# Patient Record
Sex: Male | Born: 1935 | Hispanic: Yes | Marital: Single
Health system: Southern US, Community
[De-identification: ages and names within clinical notes are randomized; demographics above are authoritative.]

---

## 2019-05-22 DIAGNOSIS — Y908 Blood alcohol level of 240 mg/100 ml or more: Secondary | ICD-10-CM | POA: Insufficient documentation

## 2019-05-22 DIAGNOSIS — F10929 Alcohol use, unspecified with intoxication, unspecified: Secondary | ICD-10-CM

## 2019-05-22 LAB — CBC
HCT: 49.5 % (ref 39.0–52.0)
Hemoglobin: 16.8 g/dL (ref 13.0–17.0)
MCH: 28.7 pg (ref 26.0–34.0)
MCHC: 33.9 g/dL (ref 30.0–36.0)
MCV: 84.5 fL (ref 80.0–100.0)
Platelets: 314 10*3/uL (ref 150–400)
RBC: 5.86 MIL/uL — ABNORMAL HIGH (ref 4.22–5.81)
RDW: 13.3 % (ref 11.5–15.5)
WBC: 10.7 10*3/uL — ABNORMAL HIGH (ref 4.0–10.5)
nRBC: 0 % (ref 0.0–0.2)

## 2019-05-22 LAB — ETHANOL: Alcohol, Ethyl (B): 437 mg/dL (ref <10)

## 2019-05-22 LAB — RAPID URINE DRUG SCREEN, HOSP PERFORMED
Amphetamines: NOT DETECTED
Barbiturates: NOT DETECTED
Benzodiazepines: NOT DETECTED
Cocaine: NOT DETECTED
Opiates: NOT DETECTED
Tetrahydrocannabinol: NOT DETECTED

## 2019-05-22 LAB — COMPREHENSIVE METABOLIC PANEL
ALT: 38 U/L (ref 0–44)
AST: 24 U/L (ref 15–41)
Albumin: 4.5 g/dL (ref 3.5–5.0)
Alkaline Phosphatase: 94 U/L (ref 38–126)
Anion gap: 10 (ref 5–15)
BUN: 5 mg/dL — ABNORMAL LOW (ref 8–23)
CO2: 23 mmol/L (ref 22–32)
Calcium: 7.8 mg/dL — ABNORMAL LOW (ref 8.9–10.3)
Chloride: 113 mmol/L — ABNORMAL HIGH (ref 98–111)
Creatinine, Ser: 0.64 mg/dL (ref 0.61–1.24)
GFR calc Af Amer: >60 mL/min (ref >60)
GFR calc non Af Amer: >60 mL/min (ref >60)
Glucose, Bld: 96 mg/dL (ref 70–99)
Potassium: 3.4 mmol/L — ABNORMAL LOW (ref 3.5–5.1)
Sodium: 146 mmol/L — ABNORMAL HIGH (ref 135–145)
Total Bilirubin: 0.9 mg/dL (ref 0.3–1.2)
Total Protein: 7.1 g/dL (ref 6.5–8.1)

## 2019-05-22 LAB — CBG MONITORING, ED: Glucose-Capillary: 97 mg/dL (ref 70–99)

## 2019-05-22 NOTE — ED Notes (Signed)
Date and time results received: 05/22/19 1946  Test: ETOH Critical Value:437  Name of Provider Notified: Dr. Ronnald Nian Orders Received? Or Actions Taken?:

## 2019-05-22 NOTE — ED Notes (Signed)
[place] [phone]) used to communicate with patient. Pt able to answer questions without difficulty. Will continue to monitor patient.

## 2019-05-22 NOTE — ED Triage Notes (Addendum)
Patient arrived by EMS from somewhere. Patient arrived w/ alcohol intoxication. Patient is responding to pain.   EMS stated that patients friends claimed that the patient had been drinking all day.   Patient is incontinent of bowel and bladder.   Patient was found collapsed at 1700.   We are unaware of patients name or health history.

## 2019-05-22 NOTE — ED Provider Notes (Signed)
[facility] DEPT Provider Note   CSN: [id number] Arrival date & time: 05/22/19  1803    History   Chief Complaint Chief Complaint  Patient presents with  . Alcohol Intoxication    HPI Jacob Brooks is a 85 y.o. male.     Level 5 caveat due to altered mental status.  EMS states that patient has been drinking alcohol all day.  Was found obtunded by his friends.  Unknown if there was any trauma.  Normal blood sugar.  They did not find any signs of trauma at the scene.  Patient will respond to pain and open his eyes spontaneously per EMS.  The history is provided by the EMS personnel.  Altered Mental Status Presenting symptoms: partial responsiveness   Severity:  Moderate Most recent episode:  Today Episode history:  Single Timing:  Constant Progression:  Unchanged Chronicity:  New Context: alcohol use     History reviewed. No pertinent past medical history.  There are no active problems to display for this patient.   History reviewed. No pertinent surgical history.      Home Medications    Prior to Admission medications   Not on File    Family History History reviewed. No pertinent family history.  Social History Social History   Tobacco Use  . Smoking status: Unknown If Ever Smoked  Substance Use Topics  . Alcohol use: Yes  . Drug use: Not on file     Allergies   Patient has no allergy information on record.   Review of Systems Review of Systems  Unable to perform ROS: Mental status change     Physical Exam Updated Vital Signs BP 127/89   Pulse 85   Temp 98.7 F (37.1 C) (Oral)   Resp 19   Wt 81.6 kg   SpO2 93%   Physical Exam Vitals signs and nursing note reviewed.  Constitutional:      General: He is not in acute distress.    Appearance: He is well-developed. He is not ill-appearing.  HENT:     Head: Normocephalic and atraumatic.  Eyes:     Extraocular Movements: Extraocular movements intact.    Conjunctiva/sclera: Conjunctivae normal.     Pupils: Pupils are equal, round, and reactive to light.  Neck:     Musculoskeletal: Normal range of motion and neck supple.  Cardiovascular:     Rate and Rhythm: Normal rate and regular rhythm.     Pulses: Normal pulses.     Heart sounds: Normal heart sounds. No murmur.  Pulmonary:     Effort: Pulmonary effort is normal. No respiratory distress.     Breath sounds: Normal breath sounds.  Abdominal:     General: There is no distension.     Palpations: Abdomen is soft.     Tenderness: There is no abdominal tenderness.  Skin:    General: Skin is warm and dry.  Neurological:     GCS: GCS eye subscore is 4. GCS verbal subscore is 1. GCS motor subscore is 5.      ED Treatments / Results  Labs (all labs ordered are listed, but only abnormal results are displayed) Labs Reviewed  ETHANOL - Abnormal; Notable for the following components:      Result Value   Alcohol, Ethyl (B) 437 (*)    All other components within normal limits  CBC - Abnormal; Notable for the following components:   WBC 10.7 (*)    RBC 5.86 (*)  All other components within normal limits  COMPREHENSIVE METABOLIC PANEL - Abnormal; Notable for the following components:   Sodium 146 (*)    Potassium 3.4 (*)    Chloride 113 (*)    BUN 5 (*)    Calcium 7.8 (*)    All other components within normal limits  RAPID URINE DRUG SCREEN, HOSP PERFORMED  CBG MONITORING, ED    EKG EKG Interpretation  Date/Time:  Thursday May 22 2019 18:15:27 EDT Ventricular Rate:  117 PR Interval:    QRS Duration: 105 QT Interval:  332 QTC Calculation: 464 R Axis:   78 Text Interpretation:  Sinus tachycardia Probable left atrial enlargement Borderline T abnormalities, inferior leads Confirmed by Virgina NorfolkAdam, Azaela Caracci [id number]) on 05/22/2019 7:12:12 PM   Radiology Ct Head Wo Contrast  Result Date: 05/22/2019 CLINICAL DATA:  Significant intoxication with altered level of consciousness. EXAM: CT  HEAD WITHOUT CONTRAST CT CERVICAL SPINE WITHOUT CONTRAST TECHNIQUE: Multidetector CT imaging of the head and cervical spine was performed following the standard protocol without intravenous contrast. Multiplanar CT image reconstructions of the cervical spine were also generated. COMPARISON:  None. FINDINGS: CT HEAD FINDINGS Brain: No evidence of acute infarction, hemorrhage, hydrocephalus, extra-axial collection or mass lesion/mass effect. Vascular: No hyperdense vessel or unexpected calcification. Skull: Normal. Negative for fracture or focal lesion. Sinuses/Orbits: No acute finding. Other: None. CT CERVICAL SPINE FINDINGS Alignment: Normal. Skull base and vertebrae: 7 cervical segments are well visualized. Vertebral body height is well maintained. No acute fracture or acute facet abnormality is noted. Soft tissues and spinal canal: The surrounding soft tissue structures are within normal limits. Upper chest: Visualized lung apices are within normal limits. Other: None IMPRESSION: CT of the head: No acute intracranial abnormality. CT of the cervical spine: No acute abnormality noted. Electronically Signed   By: Alcide CleverMark  Lukens M.D.   On: 05/22/2019 21:20   Ct Cervical Spine Wo Contrast  Result Date: 05/22/2019 CLINICAL DATA:  Significant intoxication with altered level of consciousness. EXAM: CT HEAD WITHOUT CONTRAST CT CERVICAL SPINE WITHOUT CONTRAST TECHNIQUE: Multidetector CT imaging of the head and cervical spine was performed following the standard protocol without intravenous contrast. Multiplanar CT image reconstructions of the cervical spine were also generated. COMPARISON:  None. FINDINGS: CT HEAD FINDINGS Brain: No evidence of acute infarction, hemorrhage, hydrocephalus, extra-axial collection or mass lesion/mass effect. Vascular: No hyperdense vessel or unexpected calcification. Skull: Normal. Negative for fracture or focal lesion. Sinuses/Orbits: No acute finding. Other: None. CT CERVICAL SPINE FINDINGS  Alignment: Normal. Skull base and vertebrae: 7 cervical segments are well visualized. Vertebral body height is well maintained. No acute fracture or acute facet abnormality is noted. Soft tissues and spinal canal: The surrounding soft tissue structures are within normal limits. Upper chest: Visualized lung apices are within normal limits. Other: None IMPRESSION: CT of the head: No acute intracranial abnormality. CT of the cervical spine: No acute abnormality noted. Electronically Signed   By: Alcide CleverMark  Lukens M.D.   On: 05/22/2019 21:20   Dg Chest Portable 1 View  Result Date: 05/22/2019 CLINICAL DATA:  Altered mental status, intoxicated EXAM: PORTABLE CHEST 1 VIEW COMPARISON:  None. FINDINGS: Hypoventilatory changes in the lungs. No consolidation, features of edema, pneumothorax, or effusion. Pulmonary vascularity is normally distributed. The cardiomediastinal contours are unremarkable. No acute osseous or soft tissue abnormality. IMPRESSION: Low volumes, otherwise no acute cardiopulmonary abnormality. Electronically Signed   By: Kreg ShropshirePrice  DeHay M.D.   On: 05/22/2019 19:33    Procedures Procedures (including critical care time)  Medications Ordered in ED Medications  sodium chloride 0.9 % bolus 1,000 mL (0 mLs Intravenous Stopped 05/22/19 1955)     Initial Impression / Assessment and Plan / ED Course  I have reviewed the triage vital signs and the nursing notes.  Pertinent labs & imaging results that were available during my care of the patient were reviewed by me and considered in my medical decision making (see chart for details).     Jacob Brooks is unknown male who presents to the ED with alcohol intoxication, change in mental status.  Brought in by police after was found obtunded.  Friends at the scene state he had been drinking all day.  Patient localizes the pain, opens eyes spontaneously but does not speak.  Pupils are 4 mm and reactive.  There is no signs of trauma on exam.  Will get lab work  including alcohol level, CT scan of his head.  Blood sugar is normal.  Alcohol level is in the 400s.  CT scan of head and neck are unremarkable.  Chest x-ray normal.  Lab work otherwise unremarkable.  Drug screen is negative.  Patient will metabolize ethanol and anticipate discharge to home.  Was signed out to oncoming ED provider with patient pending full metabolizing of alcohol.  Please see their note for further results, evaluation, disposition of the patient.  On reevaluation prior to discharge patient is more awake and improving.  He was able to tell me his name and his birthday but fell back asleep.  This chart was dictated using voice recognition software.  Despite best efforts to proofread,  errors can occur which can change the documentation meaning.    Final Clinical Impressions(s) / ED Diagnoses   Final diagnoses:  Alcoholic intoxication with complication [facility](HCC)    ED Discharge Orders    None       Virgina NorfolkCuratolo, Kyrian Stage, DO 05/22/19 2153

## 2019-05-23 NOTE — ED Notes (Signed)
Patient discharged home with family providing transportation. Discharge instructions reviewed with patient. No questions or concerns at time of discharge. Patient ambulatory without assistance and steady gait.

## 2019-05-23 NOTE — Discharge Instructions (Signed)
Drink alcohol responsibly. Return here for any new/acute changes.

## 2019-05-23 NOTE — ED Provider Notes (Signed)
Assumed care from Dr. Ronnald Nian at shift change.  See prior note for full H&P.  Patient found obtunded by friends after drinking all day.  No evidence of trauma.  Has been partially responsive here.  Work-up with alcohol of 437.  Plan: Patient will need to metabolize for quite a while.  Once clinically sober he can be discharged.  3:42 AM Patient has been awake and ambulatory here in the ED.  He is calling friend for ride home.  Drinking water without issue, VSS.  4:38 AM Ride here to take patient home.  Encouraged to use alcohol responsibly in the future.  Return here for any new/acute changes.   Larene Pickett, PA-C 05/23/19 [id number]    Merrily Pew, MD 05/23/19 [phone]

## 2019-05-23 NOTE — ED Notes (Signed)
Patient ambulated with no assistance and steady gait. EDP patient aware. Will continue to monitor patient.

## 2019-05-23 NOTE — ED Notes (Signed)
Patient attempting to contact family member for ride home.  

## 2021-04-05 IMAGING — CT CT HEAD WITHOUT CONTRAST
4 of 7 series · 16 of 47 positions shown, 18 images · non-contrast
Comparison: None.

CLINICAL DATA: Significant intoxication with altered level of
consciousness.

EXAM:
CT HEAD WITHOUT CONTRAST
CT CERVICAL SPINE WITHOUT CONTRAST
TECHNIQUE: Multidetector CT imaging of the head and cervical spine was
performed following the standard protocol without intravenous
contrast. Multiplanar CT image reconstructions of the cervical spine
were also generated.

[Series 3: head wo · axial · 0.47mm/px · z∈[+1350,+1455]mm · 5 of 33 slices shown, 7 images]
[im 6/33  brain]
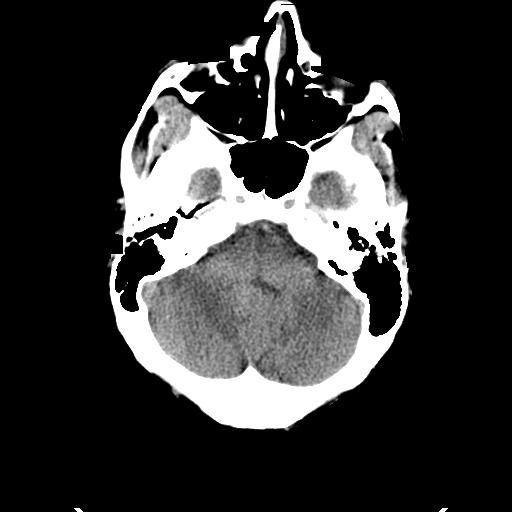
[im 6/33  bone]
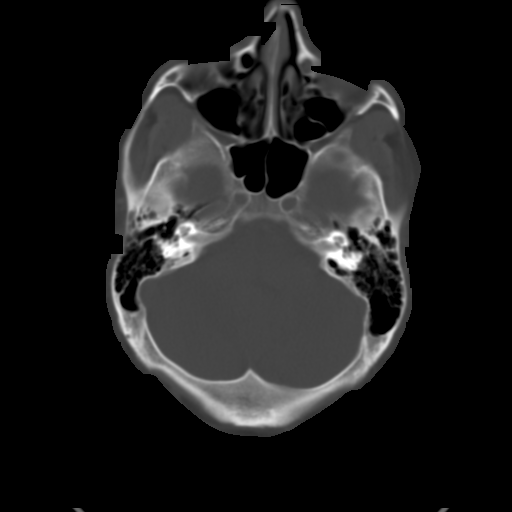
[im 11/33  brain]
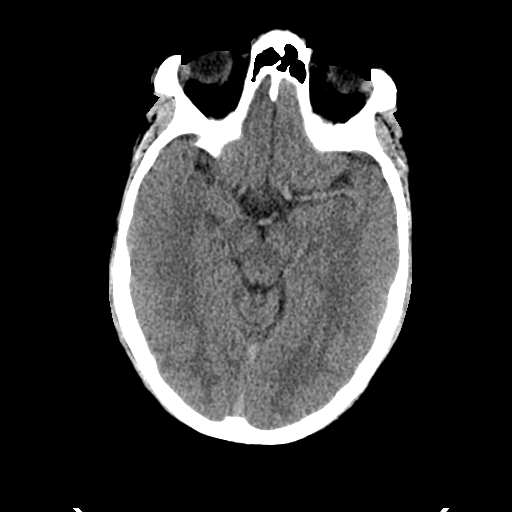
[im 17/33  brain]
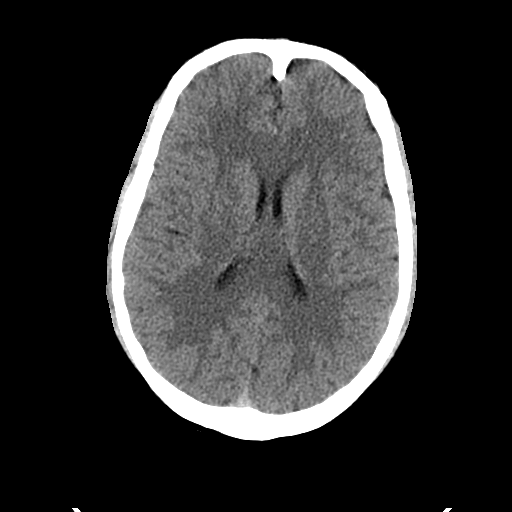
[im 22/33  brain]
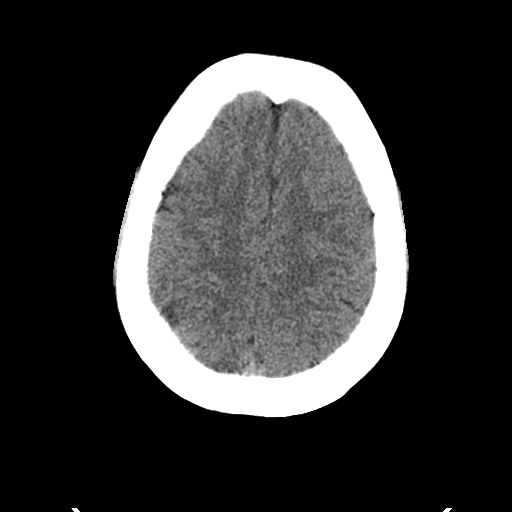
[im 27/33  brain]
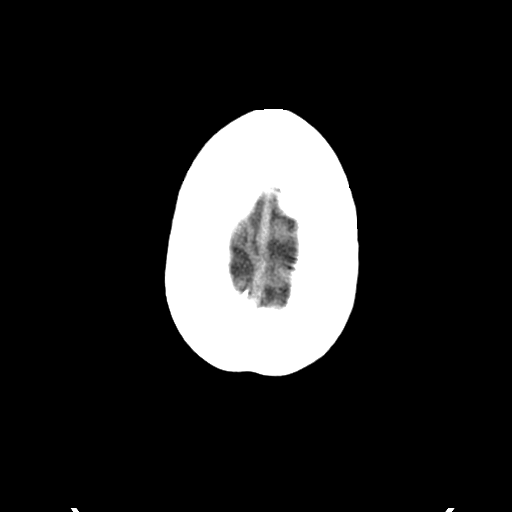
[im 27/33  bone]
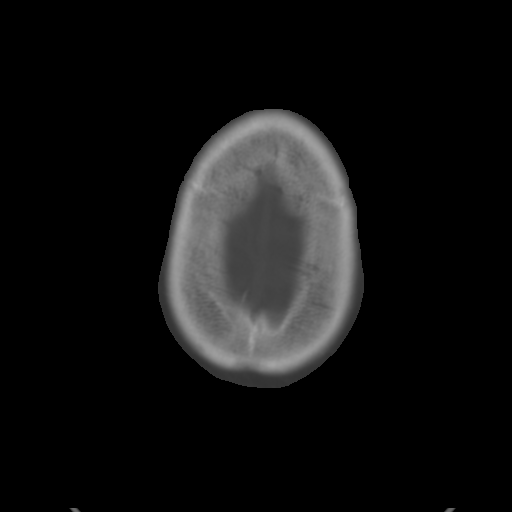

[Series 6: coronal soft tissue · coronal · 0.31mm/px · 3 of 74 slices shown]
[im 15/74  brain]
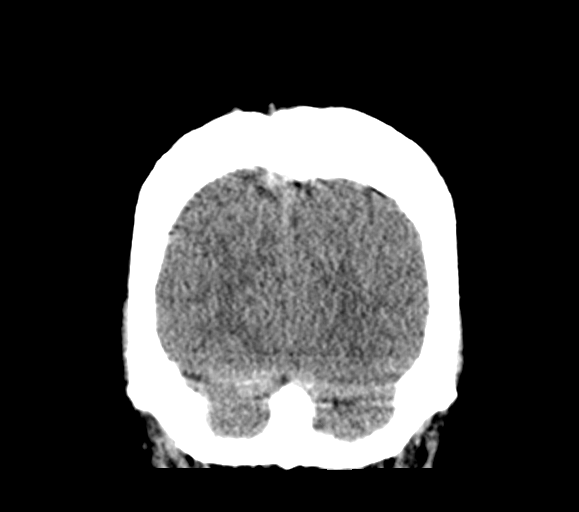
[im 30/74  brain]
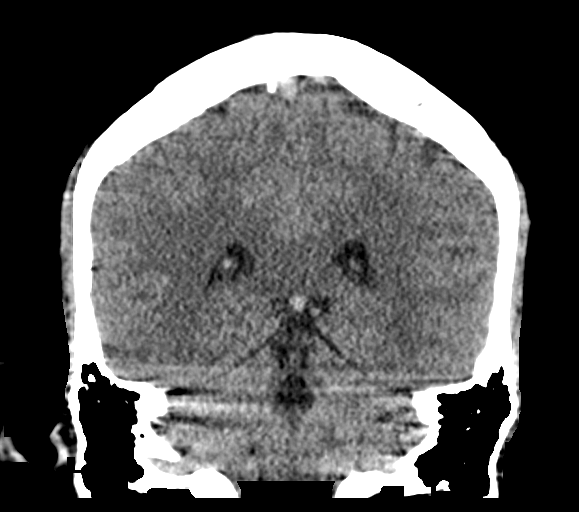
[im 44/74  brain]
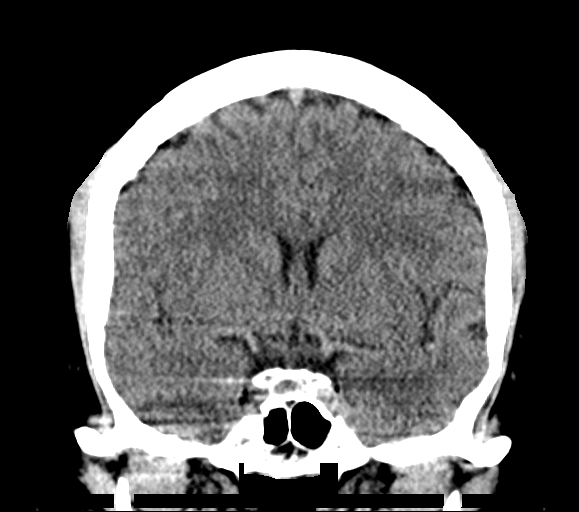

[Series 7: sagittal soft tissue · sagittal · 0.31mm/px · 2 of 64 slices shown]
[im 22/64  brain]
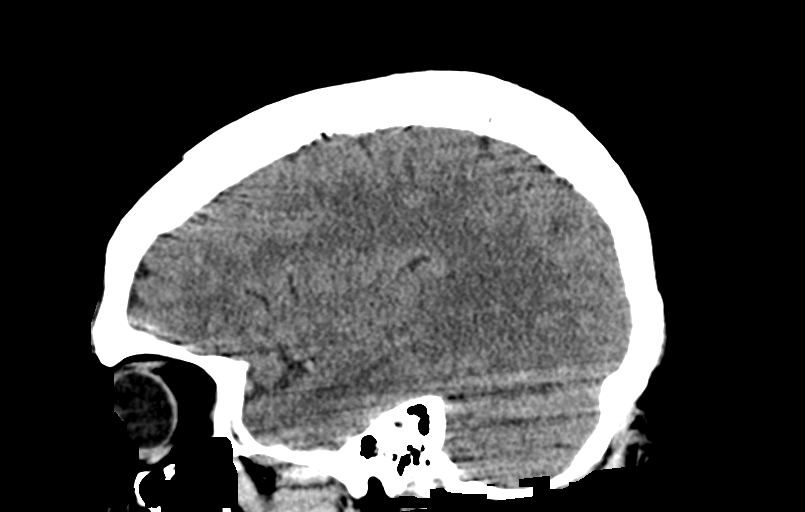
[im 43/64  brain]
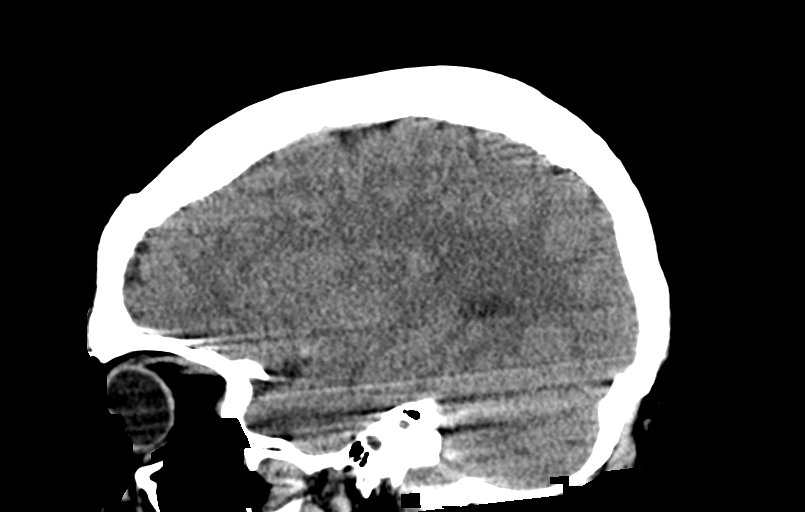

[Series 9: c spine soft · axial · 0.28mm/px · z∈[+1205,+1307]mm · 6 of 90 slices shown]
[im 10/90  brain]
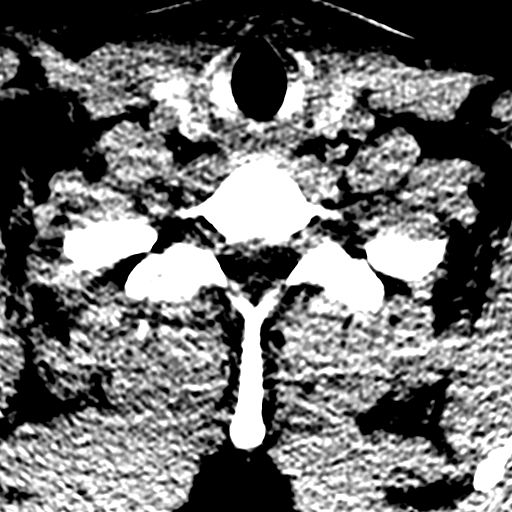
[im 19/90  brain]
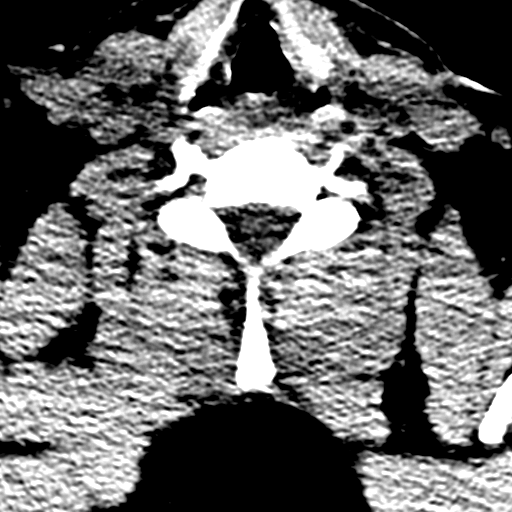
[im 29/90  brain]
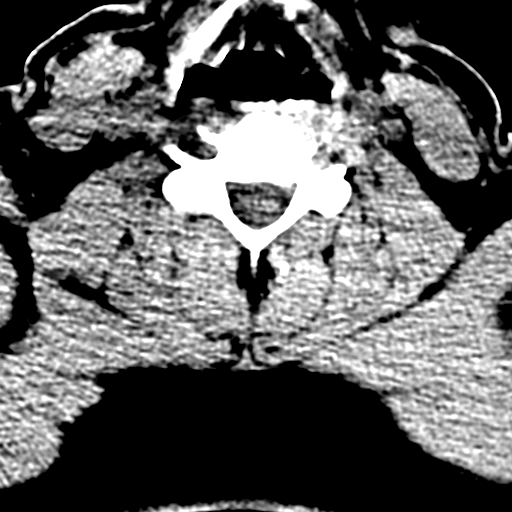
[im 38/90  brain]
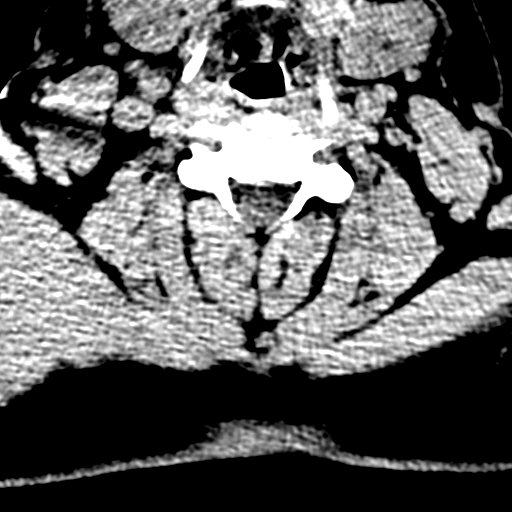
[im 52/90  brain]
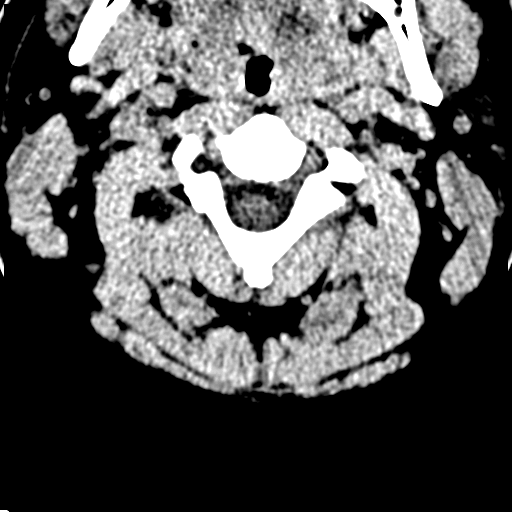
[im 61/90  brain]
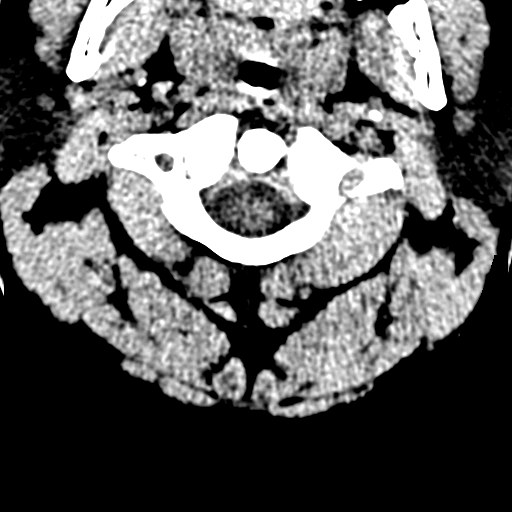

[16 of 47 positions shown; findings below may reference images not displayed]

FINDINGS: CT HEAD FINDINGS

Brain: No evidence of acute infarction, hemorrhage, hydrocephalus,
extra-axial collection or mass lesion/mass effect.

Vascular: No hyperdense vessel or unexpected calcification.

Skull: Normal. Negative for fracture or focal lesion.

Sinuses/Orbits: No acute finding.

Other: None.

CT CERVICAL SPINE FINDINGS

Alignment: Normal.

Skull base and vertebrae: 7 cervical segments are well visualized.
Vertebral body height is well maintained. No acute fracture or acute
facet abnormality is noted.

Soft tissues and spinal canal: The surrounding soft tissue
structures are within normal limits.

Upper chest: Visualized lung apices are within normal limits.

Other: None
IMPRESSION: CT of the head: No acute intracranial abnormality.

CT of the cervical spine: No acute abnormality noted.
